# Patient Record
Sex: Male | Born: 1945 | Race: White | Hispanic: No | Marital: Single | Smoking: Current every day smoker
Health system: Southern US, Community
[De-identification: ages and names within clinical notes are randomized; demographics above are authoritative.]

## PROBLEM LIST (undated history)

## (undated) DIAGNOSIS — E119 Type 2 diabetes mellitus without complications: Secondary | ICD-10-CM

## (undated) DIAGNOSIS — H269 Unspecified cataract: Secondary | ICD-10-CM

## (undated) DIAGNOSIS — E785 Hyperlipidemia, unspecified: Secondary | ICD-10-CM

## (undated) DIAGNOSIS — C801 Malignant (primary) neoplasm, unspecified: Secondary | ICD-10-CM

## (undated) DIAGNOSIS — Z87442 Personal history of urinary calculi: Secondary | ICD-10-CM

## (undated) HISTORY — DX: Personal history of urinary calculi: Z87.442

## (undated) HISTORY — DX: Malignant (primary) neoplasm, unspecified: C80.1

## (undated) HISTORY — DX: Hyperlipidemia, unspecified: E78.5

## (undated) HISTORY — PX: OTHER SURGICAL HISTORY: SHX169

## (undated) HISTORY — DX: Unspecified cataract: H26.9

## (undated) HISTORY — PX: MOUTH SURGERY: SHX715

## (undated) HISTORY — PX: COLONOSCOPY: SHX174

## (undated) HISTORY — PX: APPENDECTOMY: SHX54

## (undated) HISTORY — DX: Type 2 diabetes mellitus without complications: E11.9

## (undated) HISTORY — PX: WISDOM TOOTH EXTRACTION: SHX21

## (undated) HISTORY — PX: TONSILLECTOMY AND ADENOIDECTOMY: SUR1326

---

## 2001-08-10 ENCOUNTER — Ambulatory Visit (HOSPITAL_COMMUNITY): Admission: RE | Admit: 2001-08-10 | Discharge: 2001-08-10 | Payer: Self-pay | Admitting: Internal Medicine

## 2002-12-14 ENCOUNTER — Ambulatory Visit (HOSPITAL_COMMUNITY): Admission: RE | Admit: 2002-12-14 | Discharge: 2002-12-14 | Payer: Self-pay | Admitting: Surgery

## 2005-06-15 ENCOUNTER — Encounter: Admission: RE | Admit: 2005-06-15 | Discharge: 2005-07-01 | Payer: Self-pay | Admitting: Family Medicine

## 2006-09-02 ENCOUNTER — Ambulatory Visit: Payer: Self-pay | Admitting: Internal Medicine

## 2006-09-20 ENCOUNTER — Ambulatory Visit: Payer: Self-pay | Admitting: Internal Medicine

## 2013-08-23 ENCOUNTER — Encounter: Payer: Self-pay | Admitting: Internal Medicine

## 2013-08-31 ENCOUNTER — Encounter: Payer: Self-pay | Admitting: Internal Medicine

## 2014-02-14 ENCOUNTER — Encounter: Payer: Self-pay | Admitting: Internal Medicine

## 2014-03-09 ENCOUNTER — Other Ambulatory Visit: Payer: Self-pay | Admitting: Family Medicine

## 2014-03-09 DIAGNOSIS — Z87891 Personal history of nicotine dependence: Secondary | ICD-10-CM

## 2014-03-09 DIAGNOSIS — IMO0001 Reserved for inherently not codable concepts without codable children: Secondary | ICD-10-CM

## 2014-03-27 ENCOUNTER — Ambulatory Visit
Admission: RE | Admit: 2014-03-27 | Discharge: 2014-03-27 | Disposition: A | Discharge status: Home / Self Care | Payer: Medicare PPO | Source: Ambulatory Visit | Attending: Family Medicine | Admitting: Family Medicine

## 2014-03-27 DIAGNOSIS — Z87891 Personal history of nicotine dependence: Secondary | ICD-10-CM

## 2014-03-27 DIAGNOSIS — IMO0001 Reserved for inherently not codable concepts without codable children: Secondary | ICD-10-CM

## 2015-01-07 DIAGNOSIS — H43393 Other vitreous opacities, bilateral: Secondary | ICD-10-CM | POA: Diagnosis not present

## 2015-03-11 DIAGNOSIS — Z79899 Other long term (current) drug therapy: Secondary | ICD-10-CM | POA: Diagnosis not present

## 2015-03-11 DIAGNOSIS — R9431 Abnormal electrocardiogram [ECG] [EKG]: Secondary | ICD-10-CM | POA: Diagnosis not present

## 2015-03-11 DIAGNOSIS — Z0000 Encounter for general adult medical examination without abnormal findings: Secondary | ICD-10-CM | POA: Diagnosis not present

## 2015-03-11 DIAGNOSIS — E780 Pure hypercholesterolemia: Secondary | ICD-10-CM | POA: Diagnosis not present

## 2015-03-11 DIAGNOSIS — Z86010 Personal history of colonic polyps: Secondary | ICD-10-CM | POA: Diagnosis not present

## 2015-03-11 DIAGNOSIS — Z23 Encounter for immunization: Secondary | ICD-10-CM | POA: Diagnosis not present

## 2015-03-11 DIAGNOSIS — M4310 Spondylolisthesis, site unspecified: Secondary | ICD-10-CM | POA: Diagnosis not present

## 2015-03-11 DIAGNOSIS — N400 Enlarged prostate without lower urinary tract symptoms: Secondary | ICD-10-CM | POA: Diagnosis not present

## 2015-03-11 DIAGNOSIS — N529 Male erectile dysfunction, unspecified: Secondary | ICD-10-CM | POA: Diagnosis not present

## 2015-03-12 ENCOUNTER — Encounter: Payer: Self-pay | Admitting: Internal Medicine

## 2015-04-22 DIAGNOSIS — N401 Enlarged prostate with lower urinary tract symptoms: Secondary | ICD-10-CM | POA: Diagnosis not present

## 2015-04-22 DIAGNOSIS — R3915 Urgency of urination: Secondary | ICD-10-CM | POA: Diagnosis not present

## 2015-04-22 DIAGNOSIS — R351 Nocturia: Secondary | ICD-10-CM | POA: Diagnosis not present

## 2015-04-22 DIAGNOSIS — N138 Other obstructive and reflux uropathy: Secondary | ICD-10-CM | POA: Diagnosis not present

## 2015-05-08 DIAGNOSIS — L905 Scar conditions and fibrosis of skin: Secondary | ICD-10-CM | POA: Diagnosis not present

## 2015-05-08 DIAGNOSIS — D1801 Hemangioma of skin and subcutaneous tissue: Secondary | ICD-10-CM | POA: Diagnosis not present

## 2015-05-08 DIAGNOSIS — L723 Sebaceous cyst: Secondary | ICD-10-CM | POA: Diagnosis not present

## 2015-05-08 DIAGNOSIS — D2371 Other benign neoplasm of skin of right lower limb, including hip: Secondary | ICD-10-CM | POA: Diagnosis not present

## 2015-05-08 DIAGNOSIS — R21 Rash and other nonspecific skin eruption: Secondary | ICD-10-CM | POA: Diagnosis not present

## 2015-05-15 ENCOUNTER — Ambulatory Visit (AMBULATORY_SURGERY_CENTER): Payer: Self-pay | Admitting: *Deleted

## 2015-05-15 VITALS — Ht 66.0 in | Wt 172.0 lb

## 2015-05-15 DIAGNOSIS — Z860100 Personal history of colon polyps, unspecified: Secondary | ICD-10-CM

## 2015-05-15 NOTE — Progress Notes (Signed)
Patient denies any allergies to egg or soy products. Patient denies complications with anesthesia/sedation.  Patient denies oxygen use at home and denies diet medications. Emmi instructions for colonoscopy explained but patient refused.

## 2015-05-29 ENCOUNTER — Encounter: Payer: Medicare PPO | Admitting: Internal Medicine

## 2015-06-26 ENCOUNTER — Encounter: Payer: Self-pay | Admitting: Gastroenterology

## 2015-06-26 ENCOUNTER — Ambulatory Visit (AMBULATORY_SURGERY_CENTER): Payer: Medicare PPO | Admitting: Gastroenterology

## 2015-06-26 VITALS — BP 97/55 | HR 53 | Temp 96.6°F | Resp 69 | Ht 66.0 in | Wt 172.0 lb

## 2015-06-26 DIAGNOSIS — D128 Benign neoplasm of rectum: Secondary | ICD-10-CM

## 2015-06-26 DIAGNOSIS — D129 Benign neoplasm of anus and anal canal: Secondary | ICD-10-CM

## 2015-06-26 DIAGNOSIS — D125 Benign neoplasm of sigmoid colon: Secondary | ICD-10-CM

## 2015-06-26 DIAGNOSIS — Z86010 Personal history of colonic polyps: Secondary | ICD-10-CM | POA: Diagnosis not present

## 2015-06-26 DIAGNOSIS — N400 Enlarged prostate without lower urinary tract symptoms: Secondary | ICD-10-CM | POA: Diagnosis not present

## 2015-06-26 DIAGNOSIS — D124 Benign neoplasm of descending colon: Secondary | ICD-10-CM | POA: Diagnosis not present

## 2015-06-26 MED ORDER — SODIUM CHLORIDE 0.9 % IV SOLN: 500.0000 mL | INTRAVENOUS | Status: DC

## 2015-06-26 NOTE — Progress Notes (Signed)
Called to room to assist during endoscopic procedure.  Patient ID and intended procedure confirmed with present staff. Received instructions for my participation in the procedure from the performing physician.  

## 2015-06-26 NOTE — Patient Instructions (Addendum)
YOU HAD AN ENDOSCOPIC PROCEDURE TODAY AT THE [facility]:   Refer to the procedure report that was given to you for any specific questions about what was found during the examination.  If the procedure report does not answer your questions, please call your gastroenterologist to clarify.  If you requested that your care partner not be given the details of your procedure findings, then the procedure report has been included in a sealed envelope for you to review at your convenience later.  YOU SHOULD EXPECT: Some feelings of bloating in the abdomen. Passage of more gas than usual.  Walking can help get rid of the air that was put into your GI tract during the procedure and reduce the bloating. If you had a lower endoscopy (such as a colonoscopy or flexible sigmoidoscopy) you may notice spotting of blood in your stool or on the toilet paper. If you underwent a bowel prep for your procedure, you may not have a normal bowel movement for a few days.  Please Note:  You might notice some irritation and congestion in your nose or some drainage.  This is from the oxygen used during your procedure.  There is no need for concern and it should clear up in a day or so.  SYMPTOMS TO REPORT IMMEDIATELY:   Following lower endoscopy (colonoscopy or flexible sigmoidoscopy):  Excessive amounts of blood in the stool  Significant tenderness or worsening of abdominal pains  Swelling of the abdomen that is new, acute  Fever of 100F or higher  For urgent or emergent issues, a gastroenterologist can be reached at any hour by calling [phone].   DIET: Your first meal following the procedure should be a small meal and then it is ok to progress to your normal diet. Heavy or fried foods are harder to digest and may make you feel nauseous or bloated.  Likewise, meals heavy in dairy and vegetables can increase bloating.  Drink plenty of fluids but you should avoid alcoholic beverages for 24  hours.  ACTIVITY:  You should plan to take it easy for the rest of today and you should NOT DRIVE or use heavy machinery until tomorrow (because of the sedation medicines used during the test).    FOLLOW UP: Our staff will call the number listed on your records the next business day following your procedure to check on you and address any questions or concerns that you may have regarding the information given to you following your procedure. If we do not reach you, we will leave a message.  However, if you are feeling well and you are not experiencing any problems, there is no need to return our call.  We will assume that you have returned to your regular daily activities without incident.  If any biopsies were taken you will be contacted by phone or by letter within the next 1-3 weeks.  Please call us at [phone] if you have not heard about the biopsies in 3 weeks.    SIGNATURES/CONFIDENTIALITY: You and/or your care partner have signed paperwork which will be entered into your electronic medical record.  These signatures attest to the fact that that the information above on your After Visit Summary has been reviewed and is understood.  Full responsibility of the confidentiality of this discharge information lies with you and/or your care-partner.    Handouts were given to your care partner on polyps, diverticulosis, and a high fiber diet with liberal fluid intake. You may resume your   resume your current medications today. Await biopsy results. Please call if any questions or concerns.   

## 2015-06-26 NOTE — Progress Notes (Signed)
No problems noted in the recovery room. maw 

## 2015-06-26 NOTE — Op Note (Signed)
[facility]  [address] [place], [id number]   COLONOSCOPY PROCEDURE REPORT  PATIENT: Peter Dalton, Peter Dalton  MR#: [id number] BIRTHDATE: Aug 08, 1946 , 12  yrs. old GENDER: male ENDOSCOPIST: Yetta Flock, MD REFERRED BY: PROCEDURE DATE:  06/26/2015 PROCEDURE:   Colonoscopy with biopsy and Colonoscopy, surveillance First Screening Colonoscopy - Avg.  risk and is 50 yrs.  old or older - No.  Prior Negative Screening - Now for repeat screening. N/A  History of Adenoma - Now for follow-up colonoscopy & has been > or = to 3 yrs.  Yes hx of adenoma.  Has been 3 or more years since last colonoscopy.  Polyps removed today? Yes ASA CLASS:   Class II INDICATIONS:Surveillance due to prior colonic neoplasia and Colorectal Neoplasm Risk Assessment for this procedure is average risk. MEDICATIONS: Propofol 200 mg IV  DESCRIPTION OF PROCEDURE:   After the risks benefits and alternatives of the procedure were thoroughly explained, informed consent was obtained.  The digital rectal exam revealed no abnormalities of the rectum.   The [id number]  endoscope was introduced through the anus and advanced to the cecum, which was identified by both the appendix and ileocecal valve. No adverse events experienced.   The quality of the prep was adequate  The instrument was then slowly withdrawn as the colon was fully examined. Estimated blood loss is zero unless otherwise noted in this procedure report.  COLON FINDINGS: A diminutive polyp was removed in the descending colon with cold forceps.  A 59mm polyp was noted in the sigmoid colon and removed with cold forceps.  3 x 78mm polyps were noted in the rectum and removed with cold forceps.  There was mild diverticulosis in the sigmoid colon.  The remainder of the examined colon was normal.  Retroflexed views revealed no abnormalities. The time to cecum = 1.8 Withdrawal time = 15.1   The scope was withdrawn and the procedure  completed. COMPLICATIONS: There were no immediate complications.  ENDOSCOPIC IMPRESSION: Small polyps as described above, removed with cold forceps Mild sigmoid diverticulosis.    RECOMMENDATIONS: Resume medications Resume diet Await pathology results with further recommendations  eSigned:  Yetta Flock, MD 06/26/2015 8:16 AM   cc: the patient   PATIENT NAME:  Rivaldo, Hineman MR#: [id number]

## 2015-06-26 NOTE — Progress Notes (Signed)
Report to PACU, RN, vss, BBS= Clear.  

## 2015-06-27 ENCOUNTER — Telehealth: Payer: Self-pay | Admitting: *Deleted

## 2015-06-27 NOTE — Telephone Encounter (Signed)
  Follow up Call-  Call back number 06/26/2015  Post procedure Call Back phone  # [phone]  Permission to leave phone message Yes     Patient questions:  Do you have a fever, pain , or abdominal swelling? No. Pain Score  0 *  Have you tolerated food without any problems? Yes.    Have you been able to return to your normal activities? Yes.    Do you have any questions about your discharge instructions: Diet   No. Medications  No. Follow up visit  No.  Do you have questions or concerns about your Care? No.  Actions: * If pain score is 4 or above: No action needed, pain <4.

## 2015-07-03 ENCOUNTER — Encounter: Payer: Self-pay | Admitting: Gastroenterology

## 2016-03-10 IMAGING — US US AORTA SCREENING (MEDICARE)
1 series · 13 of 13 positions shown · non-contrast
Comparison: None.

CLINICAL DATA: Abdominal aortic ultrasound screening; history of
tobacco use and family history of a aortic aneurysm

EXAM:
ABDOMINAL AORTA SCREENING ULTRASOUND
TECHNIQUE: Ultrasound examination of the abdominal aorta was performed as a
screening evaluation for abdominal aortic aneurysm.

[Series 1: us aorta screening (medicare) · 0.27mm/px · 13 of 13 slices shown]
[im 1/13]
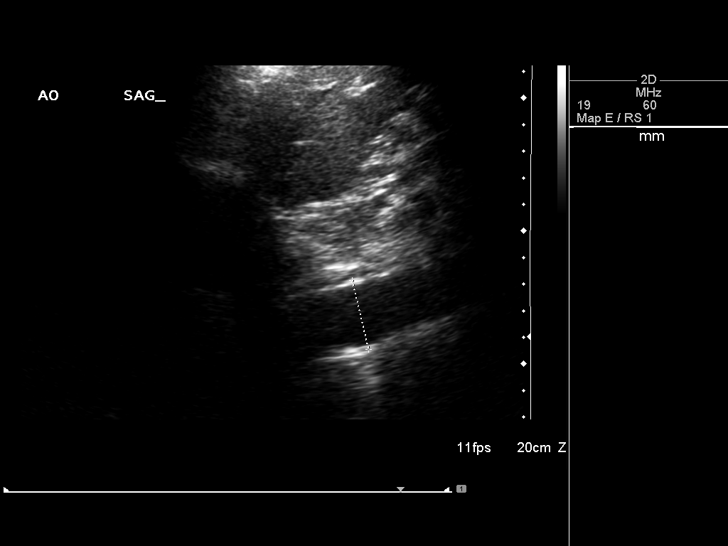
[im 2/13]
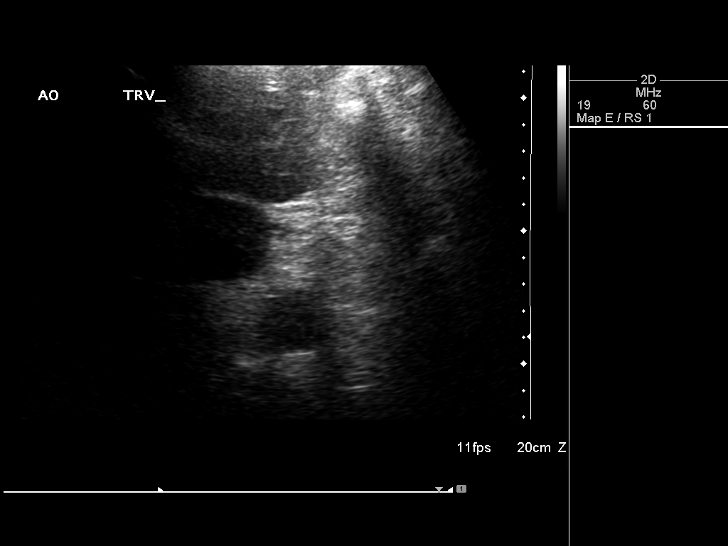
[im 3/13]
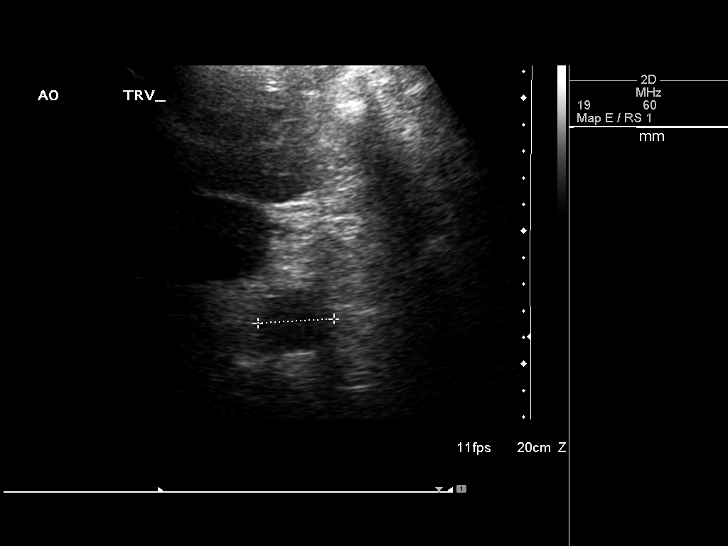
[im 4/13]
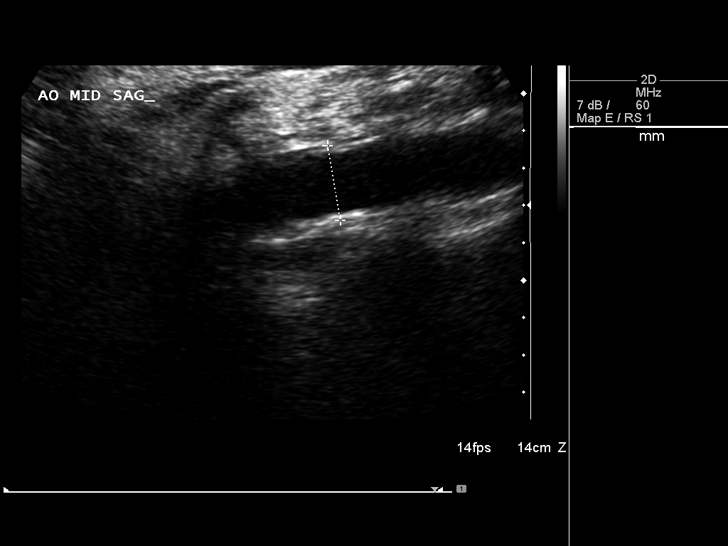
[im 5/13]
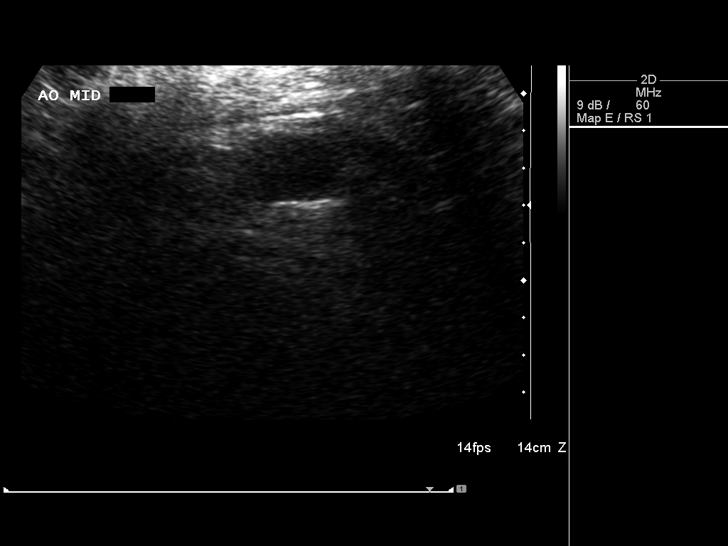
[im 6/13]
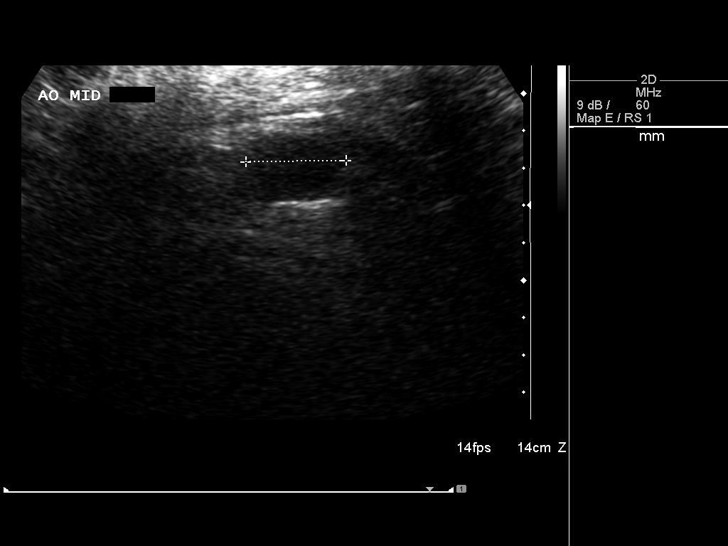
[im 7/13]
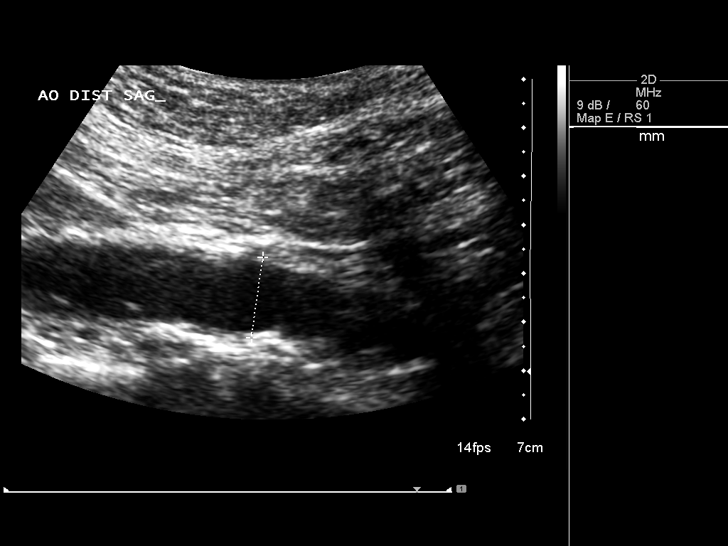
[im 8/13]
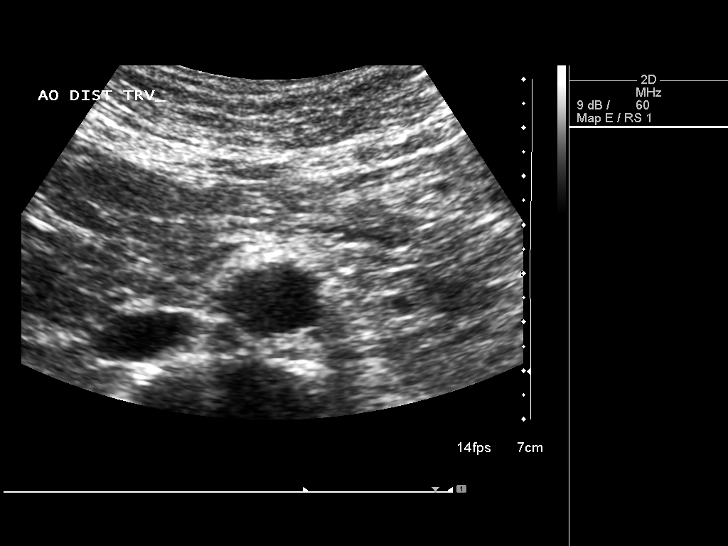
[im 9/13]
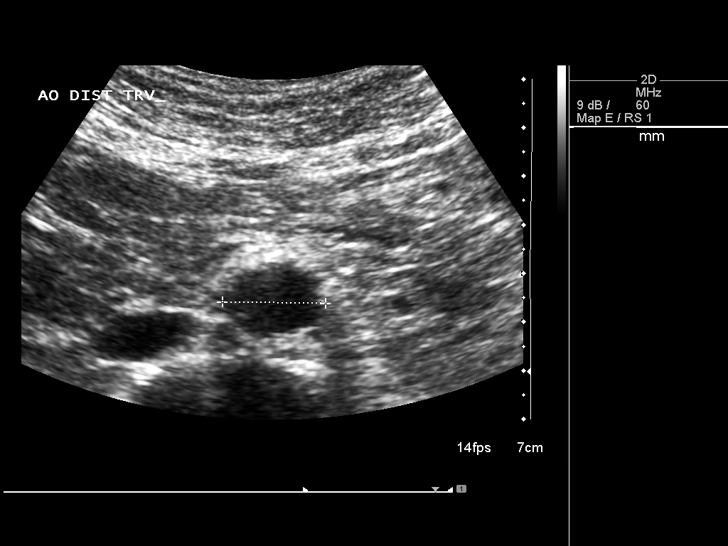
[im 10/13]
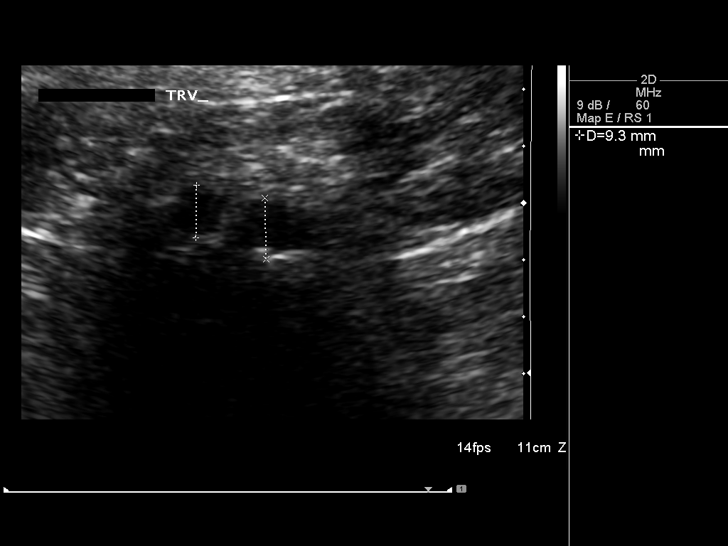
[im 11/13]
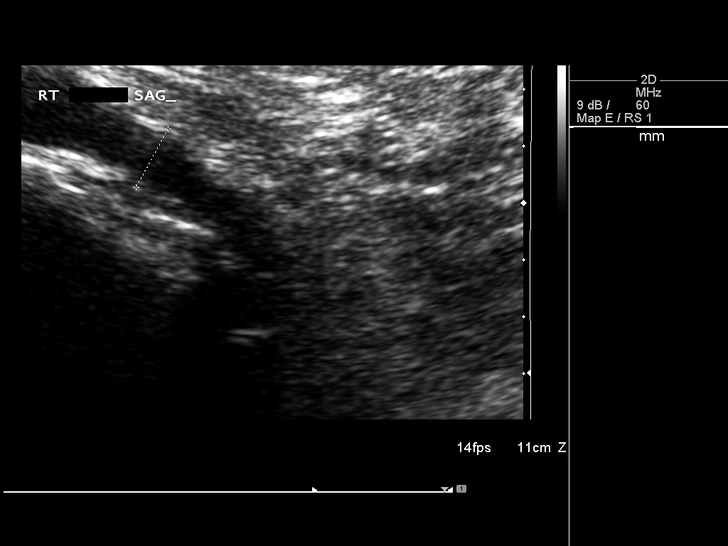
[im 12/13]
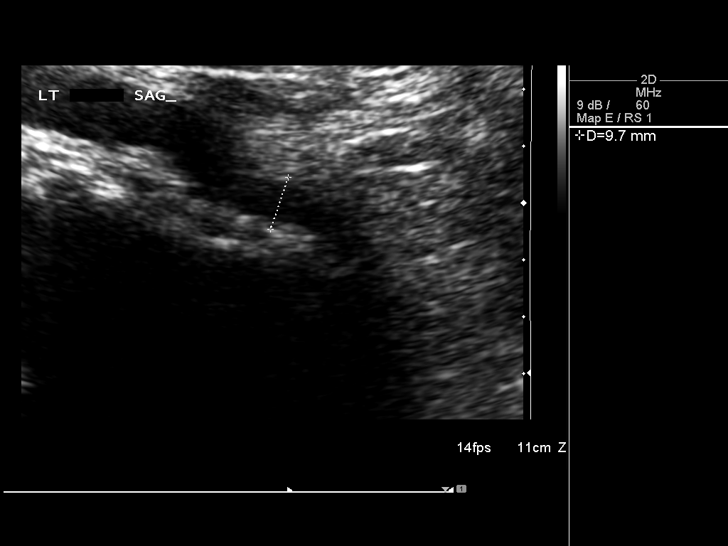
[im 13/13]
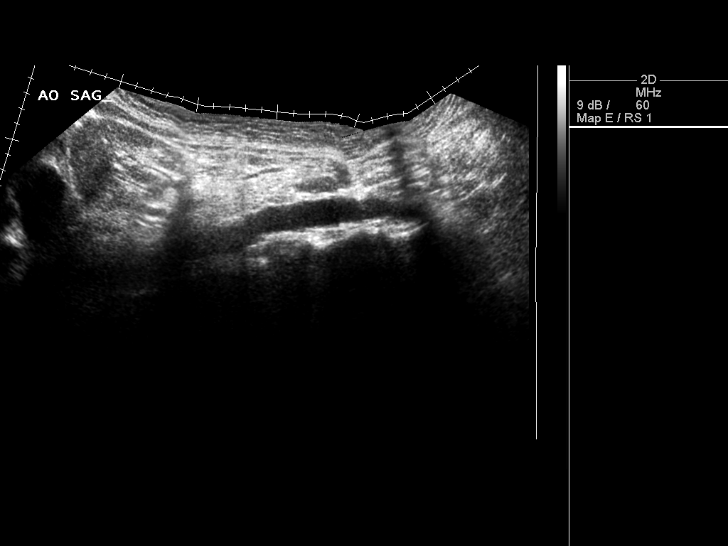

[13 of 13 positions shown; findings below may reference images not displayed]

FINDINGS: Abdominal Aorta

No aneurysm identified.

Maximum AP

Diameter:  2.7 cm

Maximum TRV

Diameter: 2.9 cm

The maximal diameter given above lying in the proximal aorta. There
is a normal taper of the aorta. The common iliac arteries are normal
in caliber.
IMPRESSION: There is no abdominal aortic aneurysm.

## 2018-05-03 ENCOUNTER — Encounter: Payer: Self-pay | Admitting: Gastroenterology

## 2018-06-14 ENCOUNTER — Encounter: Payer: Self-pay | Admitting: Gastroenterology

## 2018-06-14 ENCOUNTER — Ambulatory Visit (AMBULATORY_SURGERY_CENTER): Payer: Self-pay

## 2018-06-14 VITALS — Ht 66.0 in | Wt 174.0 lb

## 2018-06-14 DIAGNOSIS — Z86010 Personal history of colonic polyps: Secondary | ICD-10-CM

## 2018-06-14 MED ORDER — NA SULFATE-K SULFATE-MG SULF 17.5-3.13-1.6 GM/177ML PO SOLN: 1.0000 | Freq: Once | ORAL | 0 refills | Status: AC

## 2018-06-14 NOTE — Progress Notes (Signed)
No egg or soy allergy known to patient  No issues with past sedation with any surgeries  or procedures, no intubation problems  No diet pills per patient No home 02 use per patient  No blood thinners per patient  Pt denies issues with constipation  No A fib or A flutter  EMMI video sent to pt's e mail , pt declined    

## 2018-06-28 ENCOUNTER — Encounter: Payer: Self-pay | Admitting: Gastroenterology

## 2018-06-28 ENCOUNTER — Ambulatory Visit (AMBULATORY_SURGERY_CENTER): Payer: Medicare Other | Admitting: Gastroenterology

## 2018-06-28 VITALS — BP 98/57 | HR 60 | Temp 96.6°F | Resp 14 | Ht 66.0 in | Wt 174.0 lb

## 2018-06-28 DIAGNOSIS — Z86010 Personal history of colonic polyps: Secondary | ICD-10-CM | POA: Diagnosis present

## 2018-06-28 DIAGNOSIS — D120 Benign neoplasm of cecum: Secondary | ICD-10-CM

## 2018-06-28 DIAGNOSIS — D125 Benign neoplasm of sigmoid colon: Secondary | ICD-10-CM | POA: Diagnosis not present

## 2018-06-28 DIAGNOSIS — K635 Polyp of colon: Secondary | ICD-10-CM | POA: Diagnosis not present

## 2018-06-28 MED ORDER — SODIUM CHLORIDE 0.9 % IV SOLN: 500.0000 mL | Freq: Once | INTRAVENOUS | Status: AC

## 2018-06-28 NOTE — Progress Notes (Signed)
Called to room to assist during endoscopic procedure.  Patient ID and intended procedure confirmed with present staff. Received instructions for my participation in the procedure from the performing physician.  

## 2018-06-28 NOTE — Progress Notes (Signed)
Report given to PACU, vss 

## 2018-06-28 NOTE — Progress Notes (Signed)
Pt. Reports no change in his medical or surgical history since his pre-visit 06/14/2018.

## 2018-06-28 NOTE — Op Note (Signed)
[name] Procedure Date: 06/28/2018 9:51 AM MRN: [id number] Endoscopist: Remo Lipps P. Havery Moros , MD Age: 72 Referring MD:  Date of Birth: 07-24-1946 Gender: Male Account #: [id number] Procedure:                Colonoscopy Indications:              Surveillance: Personal history of multiple                            adenomatous polyps on last colonoscopy 3 years ago Medicines:                Monitored Anesthesia Care Procedure:                Pre-Anesthesia Assessment:                           - Prior to the procedure, a History and Physical                            was performed, and patient medications and                            allergies were reviewed. The patient's tolerance of                            previous anesthesia was also reviewed. The risks                            and benefits of the procedure and the sedation                            options and risks were discussed with the patient.                            All questions were answered, and informed consent                            was obtained. Prior Anticoagulants: The patient has                            taken no previous anticoagulant or antiplatelet                            agents. ASA Grade Assessment: I - A normal, healthy                            patient. After reviewing the risks and benefits,                            the patient was deemed in satisfactory condition to                            undergo the procedure.  After obtaining informed consent, the colonoscope                            was passed under direct vision. Throughout the                            procedure, the patient's blood pressure, pulse, and                            oxygen saturations were monitored continuously. The                            Colonoscope was introduced through the anus and                            advanced to the the cecum,  identified by                            appendiceal orifice and ileocecal valve. The                            colonoscopy was performed without difficulty. The                            patient tolerated the procedure well. The quality                            of the bowel preparation was good. The ileocecal                            valve, appendiceal orifice, and rectum were                            photographed. Scope In: 9:57:37 AM Scope Out: 10:18:05 AM Scope Withdrawal Time: 0 hours 16 minutes 23 seconds  Total Procedure Duration: 0 hours 20 minutes 28 seconds  Findings:                 The perianal and digital rectal examinations were                            normal.                           Two sessile polyps were found in the cecum. The                            polyps were diminutive in size. These polyps were                            removed with a cold biopsy forceps. Resection and                            retrieval were complete.  Two sessile polyps were found in the sigmoid colon.                            The polyps were 3 mm in size. These polyps were                            removed with a cold snare. Resection and retrieval                            were complete.                           A few small-mouthed diverticula were found in the                            sigmoid colon.                           Internal hemorrhoids were found during retroflexion.                           The exam was otherwise without abnormality. Complications:            No immediate complications. Estimated blood loss:                            Minimal. Estimated Blood Loss:     Estimated blood loss was minimal. Impression:               - Two diminutive polyps in the cecum, removed with                            a cold biopsy forceps. Resected and retrieved.                           - Two 3 mm polyps in the sigmoid colon, removed                             with a cold snare. Resected and retrieved.                           - Diverticulosis in the sigmoid colon.                           - Internal hemorrhoids.                           - The examination was otherwise normal. Recommendation:           - Patient has a contact number available for                            emergencies. The signs and symptoms of potential                            delayed complications were  discussed with the                            patient. Return to normal activities tomorrow.                            Written discharge instructions were provided to the                            patient.                           - Resume previous diet.                           - Continue present medications.                           - Await pathology results. Remo Lipps P. Kama Cammarano, MD 06/28/2018 10:21:59 AM This report has been signed electronically.

## 2018-06-28 NOTE — Patient Instructions (Signed)
YOU HAD AN ENDOSCOPIC PROCEDURE TODAY AT THE [facility]:   Refer to the procedure report that was given to you for any specific questions about what was found during the examination.  If the procedure report does not answer your questions, please call your gastroenterologist to clarify.  If you requested that your care partner not be given the details of your procedure findings, then the procedure report has been included in a sealed envelope for you to review at your convenience later.  YOU SHOULD EXPECT: Some feelings of bloating in the abdomen. Passage of more gas than usual.  Walking can help get rid of the air that was put into your GI tract during the procedure and reduce the bloating. If you had a lower endoscopy (such as a colonoscopy or flexible sigmoidoscopy) you may notice spotting of blood in your stool or on the toilet paper. If you underwent a bowel prep for your procedure, you may not have a normal bowel movement for a few days.  Please Note:  You might notice some irritation and congestion in your nose or some drainage.  This is from the oxygen used during your procedure.  There is no need for concern and it should clear up in a day or so.  SYMPTOMS TO REPORT IMMEDIATELY:   Following lower endoscopy (colonoscopy or flexible sigmoidoscopy):  Excessive amounts of blood in the stool  Significant tenderness or worsening of abdominal pains  Swelling of the abdomen that is new, acute  Fever of 100F or higher  For urgent or emergent issues, a gastroenterologist can be reached at any hour by calling [phone].   DIET:  We do recommend a small meal at first, but then you may proceed to your regular diet.  Drink plenty of fluids but you should avoid alcoholic beverages for 24 hours.  MEDICATIONS: Continue present medications.  Please see handouts given to you by your recovery nurse.  ACTIVITY:  You should plan to take it easy for the rest of today and you should NOT  DRIVE or use heavy machinery until tomorrow (because of the sedation medicines used during the test).    FOLLOW UP: Our staff will call the number listed on your records the next business day following your procedure to check on you and address any questions or concerns that you may have regarding the information given to you following your procedure. If we do not reach you, we will leave a message.  However, if you are feeling well and you are not experiencing any problems, there is no need to return our call.  We will assume that you have returned to your regular daily activities without incident.  If any biopsies were taken you will be contacted by phone or by letter within the next 1-3 weeks.  Please call us at [phone] if you have not heard about the biopsies in 3 weeks.   Thank you for allowing us to provide for your healthcare needs today.   SIGNATURES/CONFIDENTIALITY: You and/or your care partner have signed paperwork which will be entered into your electronic medical record.  These signatures attest to the fact that that the information above on your After Visit Summary has been reviewed and is understood.  Full responsibility of the confidentiality of this discharge information lies with you and/or your care-partner. 

## 2018-06-29 ENCOUNTER — Telehealth: Payer: Self-pay | Admitting: *Deleted

## 2018-06-29 NOTE — Telephone Encounter (Signed)
  Follow up Call-  Call back number 06/28/2018  Post procedure Call Back phone  # [phone] - voice mail doesn't work  Permission to leave phone message Yes  Some recent data might be hidden     Patient questions:  Do you have a fever, pain , or abdominal swelling? No. Pain Score  0 *  Have you tolerated food without any problems? Yes.    Have you been able to return to your normal activities? Yes.    Do you have any questions about your discharge instructions: Diet   No. Medications  No. Follow up visit  No.  Do you have questions or concerns about your Care? No.  Actions: * If pain score is 4 or above: No action needed, pain <4.

## 2018-07-06 ENCOUNTER — Encounter: Payer: Self-pay | Admitting: Gastroenterology

## 2018-09-28 ENCOUNTER — Other Ambulatory Visit: Payer: Self-pay | Admitting: Family Medicine

## 2018-09-28 ENCOUNTER — Ambulatory Visit
Admission: RE | Admit: 2018-09-28 | Discharge: 2018-09-28 | Disposition: A | Discharge status: Home / Self Care | Payer: Medicare Other | Source: Ambulatory Visit | Attending: Family Medicine | Admitting: Family Medicine

## 2018-09-28 DIAGNOSIS — R059 Cough, unspecified: Secondary | ICD-10-CM

## 2018-09-28 DIAGNOSIS — R05 Cough: Secondary | ICD-10-CM

## 2020-02-09 DIAGNOSIS — R339 Retention of urine, unspecified: Secondary | ICD-10-CM | POA: Diagnosis not present

## 2020-02-14 DIAGNOSIS — N401 Enlarged prostate with lower urinary tract symptoms: Secondary | ICD-10-CM | POA: Diagnosis not present

## 2020-02-14 DIAGNOSIS — R338 Other retention of urine: Secondary | ICD-10-CM | POA: Diagnosis not present

## 2020-03-11 DIAGNOSIS — Z961 Presence of intraocular lens: Secondary | ICD-10-CM | POA: Diagnosis not present

## 2020-03-11 DIAGNOSIS — H43813 Vitreous degeneration, bilateral: Secondary | ICD-10-CM | POA: Diagnosis not present

## 2020-03-11 DIAGNOSIS — H43393 Other vitreous opacities, bilateral: Secondary | ICD-10-CM | POA: Diagnosis not present

## 2020-03-11 DIAGNOSIS — H26493 Other secondary cataract, bilateral: Secondary | ICD-10-CM | POA: Diagnosis not present

## 2020-03-21 DIAGNOSIS — R3915 Urgency of urination: Secondary | ICD-10-CM | POA: Diagnosis not present

## 2020-03-21 DIAGNOSIS — R8279 Other abnormal findings on microbiological examination of urine: Secondary | ICD-10-CM | POA: Diagnosis not present

## 2020-03-21 DIAGNOSIS — R338 Other retention of urine: Secondary | ICD-10-CM | POA: Diagnosis not present

## 2020-03-21 DIAGNOSIS — N401 Enlarged prostate with lower urinary tract symptoms: Secondary | ICD-10-CM | POA: Diagnosis not present

## 2020-07-15 DIAGNOSIS — E663 Overweight: Secondary | ICD-10-CM | POA: Diagnosis not present

## 2020-07-15 DIAGNOSIS — Z86010 Personal history of colonic polyps: Secondary | ICD-10-CM | POA: Diagnosis not present

## 2020-07-15 DIAGNOSIS — R7303 Prediabetes: Secondary | ICD-10-CM | POA: Diagnosis not present

## 2020-07-15 DIAGNOSIS — R21 Rash and other nonspecific skin eruption: Secondary | ICD-10-CM | POA: Diagnosis not present

## 2020-07-15 DIAGNOSIS — Z0000 Encounter for general adult medical examination without abnormal findings: Secondary | ICD-10-CM | POA: Diagnosis not present

## 2020-07-15 DIAGNOSIS — N400 Enlarged prostate without lower urinary tract symptoms: Secondary | ICD-10-CM | POA: Diagnosis not present

## 2020-07-15 DIAGNOSIS — Z23 Encounter for immunization: Secondary | ICD-10-CM | POA: Diagnosis not present

## 2020-07-15 DIAGNOSIS — E7800 Pure hypercholesterolemia, unspecified: Secondary | ICD-10-CM | POA: Diagnosis not present

## 2020-09-23 DIAGNOSIS — N401 Enlarged prostate with lower urinary tract symptoms: Secondary | ICD-10-CM | POA: Diagnosis not present

## 2020-10-08 DIAGNOSIS — N401 Enlarged prostate with lower urinary tract symptoms: Secondary | ICD-10-CM | POA: Diagnosis not present

## 2020-10-08 DIAGNOSIS — R351 Nocturia: Secondary | ICD-10-CM | POA: Diagnosis not present

## 2020-10-10 DIAGNOSIS — D1801 Hemangioma of skin and subcutaneous tissue: Secondary | ICD-10-CM | POA: Diagnosis not present

## 2020-10-10 DIAGNOSIS — L570 Actinic keratosis: Secondary | ICD-10-CM | POA: Diagnosis not present

## 2020-10-10 DIAGNOSIS — Z85828 Personal history of other malignant neoplasm of skin: Secondary | ICD-10-CM | POA: Diagnosis not present

## 2020-10-10 DIAGNOSIS — L918 Other hypertrophic disorders of the skin: Secondary | ICD-10-CM | POA: Diagnosis not present

## 2020-10-10 DIAGNOSIS — D2371 Other benign neoplasm of skin of right lower limb, including hip: Secondary | ICD-10-CM | POA: Diagnosis not present

## 2020-10-10 DIAGNOSIS — L821 Other seborrheic keratosis: Secondary | ICD-10-CM | POA: Diagnosis not present

## 2020-10-25 DIAGNOSIS — E7800 Pure hypercholesterolemia, unspecified: Secondary | ICD-10-CM | POA: Diagnosis not present

## 2020-10-25 DIAGNOSIS — Z86010 Personal history of colonic polyps: Secondary | ICD-10-CM | POA: Diagnosis not present

## 2020-10-25 DIAGNOSIS — N400 Enlarged prostate without lower urinary tract symptoms: Secondary | ICD-10-CM | POA: Diagnosis not present

## 2020-10-25 DIAGNOSIS — Z7984 Long term (current) use of oral hypoglycemic drugs: Secondary | ICD-10-CM | POA: Diagnosis not present

## 2020-10-25 DIAGNOSIS — E1169 Type 2 diabetes mellitus with other specified complication: Secondary | ICD-10-CM | POA: Diagnosis not present

## 2020-10-25 DIAGNOSIS — E663 Overweight: Secondary | ICD-10-CM | POA: Diagnosis not present

## 2020-11-26 DIAGNOSIS — E1169 Type 2 diabetes mellitus with other specified complication: Secondary | ICD-10-CM | POA: Diagnosis not present

## 2020-11-26 DIAGNOSIS — N400 Enlarged prostate without lower urinary tract symptoms: Secondary | ICD-10-CM | POA: Diagnosis not present

## 2020-11-26 DIAGNOSIS — Z86010 Personal history of colonic polyps: Secondary | ICD-10-CM | POA: Diagnosis not present

## 2020-11-26 DIAGNOSIS — E663 Overweight: Secondary | ICD-10-CM | POA: Diagnosis not present

## 2020-11-26 DIAGNOSIS — E7800 Pure hypercholesterolemia, unspecified: Secondary | ICD-10-CM | POA: Diagnosis not present

## 2021-03-12 DIAGNOSIS — H43393 Other vitreous opacities, bilateral: Secondary | ICD-10-CM | POA: Diagnosis not present

## 2021-03-12 DIAGNOSIS — H26493 Other secondary cataract, bilateral: Secondary | ICD-10-CM | POA: Diagnosis not present

## 2021-03-12 DIAGNOSIS — E119 Type 2 diabetes mellitus without complications: Secondary | ICD-10-CM | POA: Diagnosis not present

## 2021-03-12 DIAGNOSIS — H35371 Puckering of macula, right eye: Secondary | ICD-10-CM | POA: Diagnosis not present

## 2021-03-25 DIAGNOSIS — N400 Enlarged prostate without lower urinary tract symptoms: Secondary | ICD-10-CM | POA: Diagnosis not present

## 2021-03-25 DIAGNOSIS — E1169 Type 2 diabetes mellitus with other specified complication: Secondary | ICD-10-CM | POA: Diagnosis not present

## 2021-03-25 DIAGNOSIS — Z86010 Personal history of colonic polyps: Secondary | ICD-10-CM | POA: Diagnosis not present

## 2021-03-25 DIAGNOSIS — E7800 Pure hypercholesterolemia, unspecified: Secondary | ICD-10-CM | POA: Diagnosis not present

## 2021-03-25 DIAGNOSIS — E663 Overweight: Secondary | ICD-10-CM | POA: Diagnosis not present

## 2021-03-25 DIAGNOSIS — R200 Anesthesia of skin: Secondary | ICD-10-CM | POA: Diagnosis not present

## 2021-05-01 DIAGNOSIS — E538 Deficiency of other specified B group vitamins: Secondary | ICD-10-CM | POA: Diagnosis not present

## 2021-05-13 ENCOUNTER — Other Ambulatory Visit: Payer: Self-pay

## 2021-05-13 ENCOUNTER — Encounter: Payer: Medicare PPO | Attending: Family Medicine | Admitting: Dietician

## 2021-05-13 ENCOUNTER — Encounter: Payer: Self-pay | Admitting: Dietician

## 2021-05-13 DIAGNOSIS — E119 Type 2 diabetes mellitus without complications: Secondary | ICD-10-CM

## 2021-05-13 NOTE — Progress Notes (Signed)
Patient was seen on 05/13/2021 for the first of a series of three diabetes self-management courses at the [facility].  Patient Education Plan per assessed needs and concerns is to attend three course education program for Diabetes Self Management Education.  The following learning objectives were met by the patient during this class: Describe diabetes, types of diabetes and pathophysiology State some common risk factors for diabetes Defines the role of glucose and insulin Describe the relationship between diabetes and cardiovascular and other risks State the members of the Healthcare Team States the rationale for glucose monitoring and when to test State their individual Target Range State the importance of logging glucose readings and how to interpret the readings Identifies A1C target Explain the correlation between A1c and eAG values State symptoms and treatment of high blood glucose and low blood glucose Explain proper technique for glucose testing and identify proper sharps disposal  Handouts given during class include: How to Thrive:  A Guide for Your Journey with Diabetes by the ADA Meal Plan Card and carbohydrate content list Dietary intake form Low Sodium Flavoring Tips Types of Fats Dining Out Label reading Snack list Planning a balanced meal The diabetes portion plate Diabetes Resources A1c to eAG Conversion Chart Blood Glucose Log Diabetes Recommended Care Schedule Support Group Diabetes Success Plan Core Class Satisfaction Survey   Follow-Up Plan: Attend core 2   

## 2021-05-20 ENCOUNTER — Encounter: Payer: Medicare PPO | Attending: Family Medicine | Admitting: Dietician

## 2021-05-20 ENCOUNTER — Encounter: Payer: Self-pay | Admitting: Dietician

## 2021-05-20 ENCOUNTER — Other Ambulatory Visit: Payer: Self-pay

## 2021-05-20 DIAGNOSIS — E119 Type 2 diabetes mellitus without complications: Secondary | ICD-10-CM | POA: Diagnosis not present

## 2021-05-20 NOTE — Progress Notes (Signed)
Patient was seen on 05/20/2021 for the second of a series of three diabetes self-management courses at the [facility]. The following learning objectives were met by the patient during this class:  Describe the role of different macronutrients on glucose Explain how carbohydrates affect blood glucose State what foods contain the most carbohydrates Demonstrate carbohydrate counting Demonstrate how to read Nutrition Facts food label Describe effects of various fats on heart health Describe the importance of good nutrition for health and healthy eating strategies Describe techniques for managing your shopping, cooking and meal planning List strategies to follow meal plan when dining out Describe the effects of alcohol on glucose and how to use it safely  Goals:  Follow Diabetes Meal Plan as instructed  Aim to spread carbs evenly throughout the day  Aim for 3 meals per day and snacks as needed Include lean protein foods to meals/snacks  Monitor glucose levels as instructed by your doctor   Follow-Up Plan: Attend Core 3 Work towards following your personal food plan.

## 2021-05-27 ENCOUNTER — Ambulatory Visit: Payer: Medicare Other

## 2021-06-17 ENCOUNTER — Other Ambulatory Visit: Payer: Self-pay

## 2021-06-17 ENCOUNTER — Encounter: Payer: Medicare PPO | Attending: Family Medicine | Admitting: Dietician

## 2021-06-17 DIAGNOSIS — E119 Type 2 diabetes mellitus without complications: Secondary | ICD-10-CM | POA: Insufficient documentation

## 2021-06-17 NOTE — Progress Notes (Signed)
Patient was seen on 06/17/2021 for the third of a series of three diabetes self-management courses at the [facility]. This was a 1:1 visit due to scheduling. Patient reports losing from 170 lbs 07/2020 to 157 lbs 06/14/2021. Fasting blood glucose 90 and 2 hours post meal 105-115. 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 Neuropathy symptoms - vitamin B-12 was found to be deficient 134 on 03/25/2021.  He is now on oral supplementation and is to follow up for further workup. Vitamin D deficiency with vitamin D of 18 on 03/25/2021.  He is now taking supplementation.  State the amount of activity recommended for healthy living Describe activities suitable for individual needs Identify ways to regularly incorporate activity into daily life Identify barriers to activity and ways to over come these barriers Identify diabetes medications being personally used and their primary action for lowering glucose and possible side effects Describe role of stress on blood glucose and develop strategies to address psychosocial issues Identify diabetes complications and ways to prevent them Explain how to manage diabetes during illness Evaluate success in meeting personal goal Establish 2-3 goals that they will plan to diligently work on  Goals:  I will be active 30 minutes or more 5 times a week Continue to monitor my blood glucose periodically Continue to take my medication as prescribed Continue to be mindful about food choices and carbohydrate intake.  Your patient has identified these potential barriers to change:  Motivation  Your patient has identified their diabetes self-care support plan as  On-line Resources American Diabetes Association Website    Plan:  Follow up 09/2021 with RD

## 2021-06-24 ENCOUNTER — Ambulatory Visit: Payer: Medicare PPO | Admitting: Neurology

## 2021-06-24 ENCOUNTER — Encounter: Payer: Self-pay | Admitting: Neurology

## 2021-06-24 VITALS — Ht 66.0 in | Wt 164.0 lb

## 2021-06-24 DIAGNOSIS — R202 Paresthesia of skin: Secondary | ICD-10-CM | POA: Insufficient documentation

## 2021-06-24 DIAGNOSIS — E538 Deficiency of other specified B group vitamins: Secondary | ICD-10-CM | POA: Insufficient documentation

## 2021-06-24 NOTE — Progress Notes (Signed)
Chief Complaint  Patient presents with   New Patient (Initial Visit)    new onset numbness of bilateral lower extremities, worse in feet    ASSESSMENT AND PLAN  Peter Dalton is a 75 y.o. male   Bilateral lower extremity paresthesia  Most consistent with small fiber neuropathy  Can be related to his prediabetes, vitamin B12 deficiency,  Will have repeat laboratory by his primary care physician soon, I have suggested peripheral neuropathy panel, including repeat B12, protein electrophoresis, inflammatory markers  Vitamin B12 deficiency  On supplement,     DIAGNOSTIC DATA (LABS, IMAGING, TESTING) - I reviewed patient records, labs, notes, testing and imaging myself where available. Laboratory evaluation March 25, 2021, normal TSH 1.31, LDL 69, A1c 6.3 vitamin D18.6, normal CMP, CBC hemoglobin of 16.2 B12 134,  MEDICAL HISTORY:  Peter Dalton is a 75 year old male, seen in request by his primary care physician Dr. Thermon Leyland, [facility] evaluation of bilateral lower extremity paresthesia, initial evaluation was June 24, 2021.  I reviewed and summarized the referring note. PMHx. HLD DM since 2021. Kidney stone. Prostate Hypertrophy.  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 vitamin B12 supplement, he noticed mild  improvement  PHYSICAL EXAM:   Vitals:   06/24/21 0909  Weight: 164 lb (74.4 kg)  Height: 5\' 6"  (1.676 m)   Not recorded     Body mass index is 26.47 kg/m.  PHYSICAL EXAMNIATION:  Gen: NAD, conversant, well nourised, well groomed                     Cardiovascular: Regular rate rhythm, no peripheral edema, warm, nontender. Eyes: Conjunctivae clear without exudates or hemorrhage Neck: Supple, no carotid bruits. Pulmonary: Clear to auscultation bilaterally   NEUROLOGICAL EXAM:  MENTAL STATUS: Speech:    Speech is normal; fluent and spontaneous with normal comprehension.  Cognition:     Orientation to time, place and person     Normal recent and remote memory     Normal Attention span and concentration     Normal Language, naming, repeating,spontaneous speech     Fund of knowledge   CRANIAL NERVES: CN II: Visual fields are full to confrontation. Pupils are round equal and briskly reactive to light. CN III, IV, VI: extraocular movement are normal. No ptosis. CN V: Facial sensation is intact to light touch CN VII: Face is symmetric with normal eye closure  CN VIII: Hearing is normal to causal conversation. CN IX, X: Phonation is normal. CN XI: Head turning and shoulder shrug are intact  MOTOR: There is no pronator drift of out-stretched arms. Muscle bulk and tone are normal. Muscle strength is normal.  REFLEXES: Reflexes are 2+ and symmetric at the biceps, triceps, knees, and absent at mildly low ankles. Plantar responses are flexor.  SENSORY: Mildly  length dependent decreased to light touch, pinprick and vibratory sensation to ankle level  COORDINATION: There is no trunk or limb dysmetria noted.  GAIT/STANCE: Posture is normal. Gait is steady with normal steps, base, arm swing, and turning. Heel and toe walking are normal. Tandem gait is normal.  Romberg is absent.  REVIEW OF SYSTEMS:  Full 14 system review of systems performed and notable only for as  above All other review of systems were negative.   ALLERGIES: No Known Allergies  HOME MEDICATIONS: Current Outpatient Medications  Medication Sig Dispense Refill   aspirin 81 MG tablet Take 81 mg by mouth daily.     finasteride (PROSCAR) 5 MG tablet Take 5 mg by mouth daily.     metFORMIN (GLUCOPHAGE) 500 MG tablet Take by mouth daily with breakfast.     rosuvastatin (CRESTOR) 40 MG tablet Take 40 mg by mouth daily.     silodosin (RAPAFLO) 8 MG CAPS capsule Take 8 mg by mouth every morning.     vitamin B-12 (CYANOCOBALAMIN) 1000 MCG tablet Take 1,000 mcg by mouth daily.     Vitamin D3 (VITAMIN D) 25 MCG tablet Take 2,000 Units by mouth in the morning and at bedtime.     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   Cataracts, bilateral    surgery done on both eyes   Diabetes mellitus without complication ([place])    History of kidney stones    passed stone   Hyperlipidemia     PAST SURGICAL HISTORY: Past Surgical History:  Procedure Laterality Date   APPENDECTOMY     cataracts Bilateral    COLONOSCOPY     MOUTH SURGERY     squamous cell excision from chest     center chest   TONSILLECTOMY AND ADENOIDECTOMY     WISDOM TOOTH EXTRACTION      FAMILY HISTORY: Family History  Problem Relation Age of Onset   Colon cancer Neg Hx    Colon polyps Neg Hx    Rectal cancer Neg Hx    Stomach cancer Neg Hx    Esophageal cancer Neg Hx     SOCIAL HISTORY: Social History   Socioeconomic History   Marital status: Single    Spouse name: Not on file   Number of children: Not on file   Years of education: Not on file   Highest education level: Not on file  Occupational History   Not on file  Tobacco Use    Smoking status: Every Day    Packs/day: 2.00    Types: Cigars, Cigarettes   Smokeless tobacco: Never  Substance and Sexual Activity   Alcohol use: Yes    Alcohol/week: 0.0 standard drinks    Comment: twice a year - occasional   Drug use: No   Sexual activity: Not on file  Other Topics Concern   Not on file  Social History Narrative   Not on file   Social Determinants of Health   Financial Resource Strain: Not on file  Food Insecurity: Not on file  Transportation Needs: Not on file  Physical Activity: Not on file  Stress: Not on file  Social Connections: Not on file  Intimate Partner Violence:  Not on file      Marcial Pacas, M.D. Ph.D.  [facility] [address], [place], [place] [address] Ph: [phone] Fax: [phone]  CC:  Vernie Shanks, MD [place],  [place] [address]  Vernie Shanks, MD

## 2021-06-24 NOTE — Patient Instructions (Signed)
Patient's symptoms are most consistent with small fiber peripheral neuropathy, likely due to his diabetes, B12 deficiency,  We often do following peripheral neuropathy laboratory evaluations  CBC CMP A1c TSH Vitamin M21 Folic acid ANA, ESR, C-reactive protein, Protein electrophoresis  Patient prefers to have laboratory at next follow-up his primary care physician Dr. Jodi Mourning office

## 2021-07-17 DIAGNOSIS — E1169 Type 2 diabetes mellitus with other specified complication: Secondary | ICD-10-CM | POA: Diagnosis not present

## 2021-07-17 DIAGNOSIS — R200 Anesthesia of skin: Secondary | ICD-10-CM | POA: Diagnosis not present

## 2021-07-17 DIAGNOSIS — Z86010 Personal history of colonic polyps: Secondary | ICD-10-CM | POA: Diagnosis not present

## 2021-07-17 DIAGNOSIS — E7800 Pure hypercholesterolemia, unspecified: Secondary | ICD-10-CM | POA: Diagnosis not present

## 2021-07-17 DIAGNOSIS — E663 Overweight: Secondary | ICD-10-CM | POA: Diagnosis not present

## 2021-07-17 DIAGNOSIS — Z0000 Encounter for general adult medical examination without abnormal findings: Secondary | ICD-10-CM | POA: Diagnosis not present

## 2021-07-17 DIAGNOSIS — N400 Enlarged prostate without lower urinary tract symptoms: Secondary | ICD-10-CM | POA: Diagnosis not present

## 2021-07-17 DIAGNOSIS — E559 Vitamin D deficiency, unspecified: Secondary | ICD-10-CM | POA: Diagnosis not present

## 2021-09-22 ENCOUNTER — Encounter: Payer: Medicare PPO | Attending: Family Medicine | Admitting: Dietician

## 2021-09-22 ENCOUNTER — Other Ambulatory Visit: Payer: Self-pay

## 2021-09-22 ENCOUNTER — Encounter: Payer: Self-pay | Admitting: Dietician

## 2021-09-22 DIAGNOSIS — E119 Type 2 diabetes mellitus without complications: Secondary | ICD-10-CM | POA: Diagnosis not present

## 2021-09-22 NOTE — Patient Instructions (Signed)
Great job on changes made!  Mindfulness with meals  Checking glucose and evaluating  Consider ways to stay more active:  Consider walking up and down your stairs for 10 minutes after each meal  Consider stopping the snack at night

## 2021-09-22 NOTE — Progress Notes (Signed)
Appointment start:  9:30 Appointment end:  10:00 Patient is here today alone.  Patient attended Diabetes Core Classes 1-3 between 05/13/21-06/17/2021 at [facility]. The purpose of the meeting today is to review information learned during those classes as well as review patient application and goals.   What are one or two positive things that you are doing right now to manage your diabetes?  Very mindful about the foods that I eat, monitoring and comparing blood glucose closely.  States that he can improve by adding exercise.  He does enjoy working on houses.  What is the hardest part about your diabetes right now, causing you the most concern, or is the most worrisome to you about your diabetes?  nothing  What questions do you have today?  none  Have you participated in any diabetes support group?  no  History:  Type 2 diabetes, HLD A1C:  6.3% 07/2022 Medications include:  Metformin, vitamin D-3, Vitamin B-12, statin  Weight:   156.4 lbs 09/22/2021 164 lbs 06/24/21 162 lbs 05/13/2021 174 lbs 06/28/2018 Steady weight loss continues which is purposeful and patient goal is 150 lbs.  Blood Glucose:  Fasting:  91-95  2 hours after starting a meal:     Average 2 hours after breakfast 98   Average 2 hours after lunch 120   Average 2 hours after diner 104  Have you had any low blood sugar readings in the past month?  none  Social History:  Patient lives with his wife.  They share shopping and cooking.  He is retired and enjoys working on houses.  He volunteers about once monthly. Exercise:  Does not like exercise other than work and lacks motivation to change.  States that he lives in a home with 2 flights of stairs and has contemplated walking them for 20 minutes per day.  24 hour diet recall: Breakfast:  1 slice buttered rye toast, fried egg, banana, very rare 2 strips bacon (uses 1 tsp bacon grease to fry egg) Snack:  none Lunch:  1 slice rye bread, peanut  butter (1 tsp), 1 slice onion, 2 slices ham, apple, cheese puffs Snack:  occasional peanuts or mixed nuts Dinner:  3-4 oz meat (steak ham, pork chop, chicken, shrimp or fish) green beans or broccoli or [place] medley, salad (lettuce, carrots, onions, cucumber, italian or sweet onion dressing), occasional hashbrowns Snack:  12 saltines and peanut butter Beverages:  water, occasional regular gingerale once every 1-2 weeks   Specific focus but not limited to the following: Review of blood glucose monitoring and interpretation including the recommended target ranges and Hgb A1c.  Reviewed patient's blood glucose log Review of carbohydrate counting, importance of regularly scheduled meals/snacks, and meal planning to improve quality of diet. Review of the effects of physical activity on glucose levels and long-term glucose control.  Recommended goal of 150 minutes of physical activity/week. Reviewed patient's medications Encouraged patient with continued control and weight loss.  Continuing Goals: Great job on changes made!  Mindfulness with meals  Checking glucose and evaluating  Consider ways to stay more active:  Consider walking up and down your stairs for 10 minutes after each meal  Consider stopping the snack at night  Future Follow up:  prn with recommendations yearly or when there are any changes

## 2021-10-09 DIAGNOSIS — N401 Enlarged prostate with lower urinary tract symptoms: Secondary | ICD-10-CM | POA: Diagnosis not present

## 2021-10-09 DIAGNOSIS — R311 Benign essential microscopic hematuria: Secondary | ICD-10-CM | POA: Diagnosis not present

## 2021-10-09 DIAGNOSIS — R351 Nocturia: Secondary | ICD-10-CM | POA: Diagnosis not present

## 2021-10-13 DIAGNOSIS — C4442 Squamous cell carcinoma of skin of scalp and neck: Secondary | ICD-10-CM | POA: Diagnosis not present

## 2021-10-13 DIAGNOSIS — L821 Other seborrheic keratosis: Secondary | ICD-10-CM | POA: Diagnosis not present

## 2021-10-13 DIAGNOSIS — D225 Melanocytic nevi of trunk: Secondary | ICD-10-CM | POA: Diagnosis not present

## 2021-10-13 DIAGNOSIS — Z85828 Personal history of other malignant neoplasm of skin: Secondary | ICD-10-CM | POA: Diagnosis not present

## 2021-10-13 DIAGNOSIS — D485 Neoplasm of uncertain behavior of skin: Secondary | ICD-10-CM | POA: Diagnosis not present

## 2021-10-13 DIAGNOSIS — L570 Actinic keratosis: Secondary | ICD-10-CM | POA: Diagnosis not present

## 2021-11-18 DIAGNOSIS — R319 Hematuria, unspecified: Secondary | ICD-10-CM | POA: Diagnosis not present

## 2021-11-18 DIAGNOSIS — R311 Benign essential microscopic hematuria: Secondary | ICD-10-CM | POA: Diagnosis not present

## 2021-11-18 DIAGNOSIS — N3289 Other specified disorders of bladder: Secondary | ICD-10-CM | POA: Diagnosis not present

## 2021-11-18 DIAGNOSIS — N281 Cyst of kidney, acquired: Secondary | ICD-10-CM | POA: Diagnosis not present

## 2021-11-18 DIAGNOSIS — K5730 Diverticulosis of large intestine without perforation or abscess without bleeding: Secondary | ICD-10-CM | POA: Diagnosis not present

## 2021-12-04 DIAGNOSIS — R311 Benign essential microscopic hematuria: Secondary | ICD-10-CM | POA: Diagnosis not present

## 2022-01-16 DIAGNOSIS — R200 Anesthesia of skin: Secondary | ICD-10-CM | POA: Diagnosis not present

## 2022-01-16 DIAGNOSIS — E559 Vitamin D deficiency, unspecified: Secondary | ICD-10-CM | POA: Diagnosis not present

## 2022-01-16 DIAGNOSIS — E7800 Pure hypercholesterolemia, unspecified: Secondary | ICD-10-CM | POA: Diagnosis not present

## 2022-01-16 DIAGNOSIS — E1169 Type 2 diabetes mellitus with other specified complication: Secondary | ICD-10-CM | POA: Diagnosis not present

## 2022-01-16 DIAGNOSIS — Z86010 Personal history of colonic polyps: Secondary | ICD-10-CM | POA: Diagnosis not present

## 2022-01-16 DIAGNOSIS — E663 Overweight: Secondary | ICD-10-CM | POA: Diagnosis not present

## 2022-01-16 DIAGNOSIS — N400 Enlarged prostate without lower urinary tract symptoms: Secondary | ICD-10-CM | POA: Diagnosis not present

## 2022-07-28 DIAGNOSIS — E1169 Type 2 diabetes mellitus with other specified complication: Secondary | ICD-10-CM | POA: Diagnosis not present

## 2022-07-28 DIAGNOSIS — S29012A Strain of muscle and tendon of back wall of thorax, initial encounter: Secondary | ICD-10-CM | POA: Diagnosis not present

## 2022-07-28 DIAGNOSIS — E7800 Pure hypercholesterolemia, unspecified: Secondary | ICD-10-CM | POA: Diagnosis not present

## 2022-08-04 DIAGNOSIS — E7800 Pure hypercholesterolemia, unspecified: Secondary | ICD-10-CM | POA: Diagnosis not present

## 2022-08-04 DIAGNOSIS — Z23 Encounter for immunization: Secondary | ICD-10-CM | POA: Diagnosis not present

## 2022-08-04 DIAGNOSIS — Z122 Encounter for screening for malignant neoplasm of respiratory organs: Secondary | ICD-10-CM | POA: Diagnosis not present

## 2022-08-04 DIAGNOSIS — E1169 Type 2 diabetes mellitus with other specified complication: Secondary | ICD-10-CM | POA: Diagnosis not present

## 2022-08-04 DIAGNOSIS — Z0000 Encounter for general adult medical examination without abnormal findings: Secondary | ICD-10-CM | POA: Diagnosis not present

## 2022-08-04 DIAGNOSIS — E559 Vitamin D deficiency, unspecified: Secondary | ICD-10-CM | POA: Diagnosis not present

## 2022-08-05 ENCOUNTER — Other Ambulatory Visit: Payer: Self-pay | Admitting: Family Medicine

## 2022-08-05 DIAGNOSIS — Z122 Encounter for screening for malignant neoplasm of respiratory organs: Secondary | ICD-10-CM

## 2022-09-09 ENCOUNTER — Ambulatory Visit
Admission: RE | Admit: 2022-09-09 | Discharge: 2022-09-09 | Disposition: A | Discharge status: Home / Self Care | Payer: Medicare PPO | Source: Ambulatory Visit | Attending: Family Medicine | Admitting: Family Medicine

## 2022-09-09 DIAGNOSIS — F17210 Nicotine dependence, cigarettes, uncomplicated: Secondary | ICD-10-CM | POA: Diagnosis not present

## 2022-09-09 DIAGNOSIS — Z122 Encounter for screening for malignant neoplasm of respiratory organs: Secondary | ICD-10-CM

## 2022-10-13 DIAGNOSIS — D2371 Other benign neoplasm of skin of right lower limb, including hip: Secondary | ICD-10-CM | POA: Diagnosis not present

## 2022-10-13 DIAGNOSIS — D692 Other nonthrombocytopenic purpura: Secondary | ICD-10-CM | POA: Diagnosis not present

## 2022-10-13 DIAGNOSIS — L905 Scar conditions and fibrosis of skin: Secondary | ICD-10-CM | POA: Diagnosis not present

## 2022-10-13 DIAGNOSIS — Z85828 Personal history of other malignant neoplasm of skin: Secondary | ICD-10-CM | POA: Diagnosis not present

## 2022-10-13 DIAGNOSIS — L821 Other seborrheic keratosis: Secondary | ICD-10-CM | POA: Diagnosis not present

## 2022-10-13 DIAGNOSIS — D225 Melanocytic nevi of trunk: Secondary | ICD-10-CM | POA: Diagnosis not present

## 2022-10-13 DIAGNOSIS — L814 Other melanin hyperpigmentation: Secondary | ICD-10-CM | POA: Diagnosis not present

## 2022-12-23 DIAGNOSIS — N528 Other male erectile dysfunction: Secondary | ICD-10-CM | POA: Diagnosis not present

## 2022-12-23 DIAGNOSIS — N401 Enlarged prostate with lower urinary tract symptoms: Secondary | ICD-10-CM | POA: Diagnosis not present

## 2022-12-23 DIAGNOSIS — R351 Nocturia: Secondary | ICD-10-CM | POA: Diagnosis not present

## 2023-01-28 DIAGNOSIS — J439 Emphysema, unspecified: Secondary | ICD-10-CM | POA: Diagnosis not present

## 2023-01-28 DIAGNOSIS — Z125 Encounter for screening for malignant neoplasm of prostate: Secondary | ICD-10-CM | POA: Diagnosis not present

## 2023-01-28 DIAGNOSIS — E119 Type 2 diabetes mellitus without complications: Secondary | ICD-10-CM | POA: Diagnosis not present

## 2023-01-28 DIAGNOSIS — I700 Atherosclerosis of aorta: Secondary | ICD-10-CM | POA: Diagnosis not present

## 2023-01-28 DIAGNOSIS — E7800 Pure hypercholesterolemia, unspecified: Secondary | ICD-10-CM | POA: Diagnosis not present

## 2023-01-28 DIAGNOSIS — E559 Vitamin D deficiency, unspecified: Secondary | ICD-10-CM | POA: Diagnosis not present

## 2023-01-28 DIAGNOSIS — Z6826 Body mass index (BMI) 26.0-26.9, adult: Secondary | ICD-10-CM | POA: Diagnosis not present

## 2023-06-02 DIAGNOSIS — H35371 Puckering of macula, right eye: Secondary | ICD-10-CM | POA: Diagnosis not present

## 2023-06-02 DIAGNOSIS — Z961 Presence of intraocular lens: Secondary | ICD-10-CM | POA: Diagnosis not present

## 2023-06-02 DIAGNOSIS — H26493 Other secondary cataract, bilateral: Secondary | ICD-10-CM | POA: Diagnosis not present

## 2023-06-02 DIAGNOSIS — E119 Type 2 diabetes mellitus without complications: Secondary | ICD-10-CM | POA: Diagnosis not present

## 2023-06-02 DIAGNOSIS — H43813 Vitreous degeneration, bilateral: Secondary | ICD-10-CM | POA: Diagnosis not present

## 2023-08-06 ENCOUNTER — Other Ambulatory Visit: Payer: Self-pay | Admitting: Family Medicine

## 2023-08-06 DIAGNOSIS — E7800 Pure hypercholesterolemia, unspecified: Secondary | ICD-10-CM | POA: Diagnosis not present

## 2023-08-06 DIAGNOSIS — N529 Male erectile dysfunction, unspecified: Secondary | ICD-10-CM | POA: Diagnosis not present

## 2023-08-06 DIAGNOSIS — Z122 Encounter for screening for malignant neoplasm of respiratory organs: Secondary | ICD-10-CM | POA: Diagnosis not present

## 2023-08-06 DIAGNOSIS — F17200 Nicotine dependence, unspecified, uncomplicated: Secondary | ICD-10-CM | POA: Diagnosis not present

## 2023-08-06 DIAGNOSIS — E119 Type 2 diabetes mellitus without complications: Secondary | ICD-10-CM | POA: Diagnosis not present

## 2023-08-06 DIAGNOSIS — E559 Vitamin D deficiency, unspecified: Secondary | ICD-10-CM | POA: Diagnosis not present

## 2023-08-06 DIAGNOSIS — I700 Atherosclerosis of aorta: Secondary | ICD-10-CM | POA: Diagnosis not present

## 2023-08-06 DIAGNOSIS — Z0000 Encounter for general adult medical examination without abnormal findings: Secondary | ICD-10-CM | POA: Diagnosis not present

## 2023-08-23 ENCOUNTER — Encounter: Payer: Self-pay | Admitting: Family Medicine

## 2023-09-21 ENCOUNTER — Ambulatory Visit
Admission: RE | Admit: 2023-09-21 | Discharge: 2023-09-21 | Disposition: A | Discharge status: Home / Self Care | Payer: Medicare PPO | Source: Ambulatory Visit | Attending: Family Medicine | Admitting: Family Medicine

## 2023-09-21 DIAGNOSIS — F17210 Nicotine dependence, cigarettes, uncomplicated: Secondary | ICD-10-CM | POA: Diagnosis not present

## 2023-09-21 DIAGNOSIS — Z122 Encounter for screening for malignant neoplasm of respiratory organs: Secondary | ICD-10-CM

## 2023-10-14 DIAGNOSIS — Z85828 Personal history of other malignant neoplasm of skin: Secondary | ICD-10-CM | POA: Diagnosis not present

## 2023-10-14 DIAGNOSIS — L821 Other seborrheic keratosis: Secondary | ICD-10-CM | POA: Diagnosis not present

## 2023-10-14 DIAGNOSIS — L570 Actinic keratosis: Secondary | ICD-10-CM | POA: Diagnosis not present

## 2023-10-14 DIAGNOSIS — L905 Scar conditions and fibrosis of skin: Secondary | ICD-10-CM | POA: Diagnosis not present

## 2023-12-01 DIAGNOSIS — J439 Emphysema, unspecified: Secondary | ICD-10-CM | POA: Diagnosis not present

## 2023-12-01 DIAGNOSIS — M545 Low back pain, unspecified: Secondary | ICD-10-CM | POA: Diagnosis not present

## 2023-12-01 DIAGNOSIS — E119 Type 2 diabetes mellitus without complications: Secondary | ICD-10-CM | POA: Diagnosis not present

## 2023-12-20 DIAGNOSIS — R351 Nocturia: Secondary | ICD-10-CM | POA: Diagnosis not present

## 2023-12-20 DIAGNOSIS — N528 Other male erectile dysfunction: Secondary | ICD-10-CM | POA: Diagnosis not present

## 2023-12-20 DIAGNOSIS — N401 Enlarged prostate with lower urinary tract symptoms: Secondary | ICD-10-CM | POA: Diagnosis not present

## 2023-12-23 DIAGNOSIS — M542 Cervicalgia: Secondary | ICD-10-CM | POA: Diagnosis not present

## 2024-02-08 DIAGNOSIS — E7800 Pure hypercholesterolemia, unspecified: Secondary | ICD-10-CM | POA: Diagnosis not present

## 2024-02-08 DIAGNOSIS — I700 Atherosclerosis of aorta: Secondary | ICD-10-CM | POA: Diagnosis not present

## 2024-02-08 DIAGNOSIS — F17200 Nicotine dependence, unspecified, uncomplicated: Secondary | ICD-10-CM | POA: Diagnosis not present

## 2024-02-08 DIAGNOSIS — E559 Vitamin D deficiency, unspecified: Secondary | ICD-10-CM | POA: Diagnosis not present

## 2024-02-08 DIAGNOSIS — L237 Allergic contact dermatitis due to plants, except food: Secondary | ICD-10-CM | POA: Diagnosis not present

## 2024-02-08 DIAGNOSIS — E119 Type 2 diabetes mellitus without complications: Secondary | ICD-10-CM | POA: Diagnosis not present

## 2024-06-09 DIAGNOSIS — H35371 Puckering of macula, right eye: Secondary | ICD-10-CM | POA: Diagnosis not present

## 2024-06-09 DIAGNOSIS — H26493 Other secondary cataract, bilateral: Secondary | ICD-10-CM | POA: Diagnosis not present

## 2024-06-09 DIAGNOSIS — Z961 Presence of intraocular lens: Secondary | ICD-10-CM | POA: Diagnosis not present

## 2024-06-09 DIAGNOSIS — H43813 Vitreous degeneration, bilateral: Secondary | ICD-10-CM | POA: Diagnosis not present

## 2024-06-09 DIAGNOSIS — E119 Type 2 diabetes mellitus without complications: Secondary | ICD-10-CM | POA: Diagnosis not present

## 2024-08-17 DIAGNOSIS — R002 Palpitations: Secondary | ICD-10-CM | POA: Diagnosis not present

## 2024-08-17 DIAGNOSIS — E119 Type 2 diabetes mellitus without complications: Secondary | ICD-10-CM | POA: Diagnosis not present

## 2024-08-17 DIAGNOSIS — E559 Vitamin D deficiency, unspecified: Secondary | ICD-10-CM | POA: Diagnosis not present

## 2024-08-17 DIAGNOSIS — E7800 Pure hypercholesterolemia, unspecified: Secondary | ICD-10-CM | POA: Diagnosis not present

## 2024-08-18 ENCOUNTER — Other Ambulatory Visit: Payer: Self-pay | Admitting: Family Medicine

## 2024-08-18 DIAGNOSIS — Z122 Encounter for screening for malignant neoplasm of respiratory organs: Secondary | ICD-10-CM

## 2024-09-19 ENCOUNTER — Encounter: Payer: Self-pay | Admitting: Family Medicine

## 2024-09-21 ENCOUNTER — Ambulatory Visit
Admission: RE | Admit: 2024-09-21 | Discharge: 2024-09-21 | Disposition: A | Discharge status: Home / Self Care | Source: Ambulatory Visit | Attending: Family Medicine | Admitting: Family Medicine

## 2024-09-21 DIAGNOSIS — Z122 Encounter for screening for malignant neoplasm of respiratory organs: Secondary | ICD-10-CM
